# Patient Record
Sex: Female | Born: 2000 | Race: Black or African American | Hispanic: No | Marital: Single
Health system: Southern US, Community
[De-identification: ages and names within clinical notes are randomized; demographics above are authoritative.]

---
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
## 2021-09-12 ENCOUNTER — Emergency Department
Admission: EM | Admit: 2021-09-12 | Discharge: 2021-09-12 | Disposition: A | Discharge status: Home / Self Care | Payer: No Typology Code available for payment source | Attending: Emergency Medicine | Admitting: Emergency Medicine

## 2021-09-12 ENCOUNTER — Other Ambulatory Visit: Payer: Self-pay

## 2021-09-12 ENCOUNTER — Emergency Department: Payer: No Typology Code available for payment source

## 2021-09-12 ENCOUNTER — Encounter: Payer: Self-pay | Admitting: Emergency Medicine

## 2021-09-12 DIAGNOSIS — M25512 Pain in left shoulder: Secondary | ICD-10-CM | POA: Insufficient documentation

## 2021-09-12 DIAGNOSIS — R079 Chest pain, unspecified: Secondary | ICD-10-CM | POA: Insufficient documentation

## 2021-09-12 DIAGNOSIS — Y92410 Unspecified street and highway as the place of occurrence of the external cause: Secondary | ICD-10-CM | POA: Diagnosis not present
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
## 2021-09-12 NOTE — Discharge Instructions (Addendum)
-  Take Tylenol/ibuprofen as needed for pain. -Return to the emergency department anytime if you begin to experience any new or worsening symptoms.

## 2021-09-12 NOTE — ED Notes (Signed)
D/c instructions reviewed with pt. Pt v/u.
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
## 2022-02-18 ENCOUNTER — Other Ambulatory Visit: Payer: Self-pay

## 2022-02-18 ENCOUNTER — Encounter: Payer: Self-pay | Admitting: Emergency Medicine

## 2022-02-18 ENCOUNTER — Emergency Department
Admission: EM | Admit: 2022-02-18 | Discharge: 2022-02-18 | Disposition: A | Discharge status: Home / Self Care | Payer: Medicaid Other | Attending: Student in an Organized Health Care Education/Training Program | Admitting: Student in an Organized Health Care Education/Training Program

## 2022-02-18 DIAGNOSIS — J029 Acute pharyngitis, unspecified: Secondary | ICD-10-CM | POA: Insufficient documentation

## 2022-02-18 MED ORDER — AMOXICILLIN 500 MG PO TABS: 500.0000 mg | ORAL_TABLET | Freq: Three times a day (TID) | ORAL | 0 refills | Status: AC

## 2022-02-18 NOTE — Discharge Instructions (Signed)
Please take the medication as prescribed and until finished.  Take tylenol or ibuprofen if needed for pain or fever.  Follow up with primary care or return to the ER if symptoms worsen and you are unable to see primary care.

## 2022-02-18 NOTE — ED Provider Notes (Signed)
   [facility] Provider Note    Event Date/Time   First MD Initiated Contact with Patient 02/18/22 1442     (approximate)   History   Sore Throat   HPI  Leah Barr is a 21 y.o. female  with no significant past medical history presents to the emergency department for treatment and evaluation of sore throat for the past 2 days. No known fever. No known exposure to strep throat or Covid. No alleviating measures prior to arrival.   History reviewed. No pertinent past medical history.   Physical Exam   Triage Vital Signs: ED Triage Vitals  Enc Vitals Group     BP 02/18/22 1438 115/83     Pulse Rate 02/18/22 1438 (!) 105     Resp 02/18/22 1438 18     Temp 02/18/22 1438 98.3 F (36.8 C)     Temp Source 02/18/22 1438 Oral     SpO2 02/18/22 1438 100 %     Weight 02/18/22 1439 150 lb (68 kg)     Height 02/18/22 1439 5\' 5"  (1.651 m)     Head Circumference --      Peak Flow --      Pain Score 02/18/22 1438 6     Pain Loc --      Pain Edu? --      Excl. in GC? --     Most recent vital signs: Vitals:   02/18/22 1438  BP: 115/83  Pulse: (!) 105  Resp: 18  Temp: 98.3 F (36.8 C)  SpO2: 100%    General: Awake, no distress.  CV:  Good peripheral perfusion.  Resp:  Normal effort.  Abd:  No distention.  Other:  Tonsils 2+ with copious exudate   ED Results / Procedures / Treatments   Labs (all labs ordered are listed, but only abnormal results are displayed) Labs Reviewed - No data to display   EKG     RADIOLOGY  Not indicated.  I have independently reviewed and interpreted imaging as well as reviewed report from radiology.  PROCEDURES:  Critical Care performed: No  Procedures   MEDICATIONS ORDERED IN ED:  Medications - No data to display   IMPRESSION / MDM / ASSESSMENT AND PLAN / ED COURSE   I reviewed the triage vital signs and the nursing notes.  Differential diagnosis includes, but is not limited to:  Strep throat, Mono, Covid  Patient's presentation is most consistent with acute, uncomplicated illness.  21 year old female presents to the ER for evaluation of sore throat x 2 days. Exam consistent with strep throat. She will be treated with Amoxicillin and advised to take naprosyn or tylenol for pain or fever. She is to follow up with PCP or return to the ER for symptoms that change or worsen.      FINAL CLINICAL IMPRESSION(S) / ED DIAGNOSES   Final diagnoses:  Exudative pharyngitis     Rx / DC Orders   ED Discharge Orders          Ordered    amoxicillin (AMOXIL) 500 MG tablet  3 times daily        02/18/22 1444             Note:  This document was prepared using Dragon voice recognition software and may include unintentional dictation errors.   02/20/22, FNP 02/21/22 02/23/22    [id number], MD 02/22/22 [phone]

## 2022-02-18 NOTE — ED Triage Notes (Signed)
Patient to ED for sore throat x 2 days. States its uncomfortable to swallow and just not feeling well. Denies fevers.

## 2023-07-22 ENCOUNTER — Other Ambulatory Visit: Payer: Self-pay

## 2023-07-22 DIAGNOSIS — R109 Unspecified abdominal pain: Secondary | ICD-10-CM | POA: Insufficient documentation

## 2023-07-22 DIAGNOSIS — Z5321 Procedure and treatment not carried out due to patient leaving prior to being seen by health care provider: Secondary | ICD-10-CM | POA: Insufficient documentation

## 2023-07-22 DIAGNOSIS — R197 Diarrhea, unspecified: Secondary | ICD-10-CM | POA: Diagnosis not present

## 2023-07-22 DIAGNOSIS — R112 Nausea with vomiting, unspecified: Secondary | ICD-10-CM | POA: Diagnosis present

## 2023-07-22 LAB — COMPREHENSIVE METABOLIC PANEL
ALT: 30 U/L (ref 0–44)
AST: 31 U/L (ref 15–41)
Albumin: 3.9 g/dL (ref 3.5–5.0)
Alkaline Phosphatase: 68 U/L (ref 38–126)
Anion gap: 9 (ref 5–15)
BUN: 16 mg/dL (ref 6–20)
CO2: 23 mmol/L (ref 22–32)
Calcium: 9 mg/dL (ref 8.9–10.3)
Chloride: 101 mmol/L (ref 98–111)
Creatinine, Ser: 0.75 mg/dL (ref 0.44–1.00)
GFR, Estimated: >60 mL/min (ref >60)
Glucose, Bld: 177 mg/dL — ABNORMAL HIGH (ref 70–99)
Potassium: 3.7 mmol/L (ref 3.5–5.1)
Sodium: 133 mmol/L — ABNORMAL LOW (ref 135–145)
Total Bilirubin: 0.8 mg/dL (ref <1.2)
Total Protein: 9.1 g/dL — ABNORMAL HIGH (ref 6.5–8.1)

## 2023-07-22 LAB — CBC
HCT: 29.9 % — ABNORMAL LOW (ref 36.0–46.0)
Hemoglobin: 7.8 g/dL — ABNORMAL LOW (ref 12.0–15.0)
MCH: 16.7 pg — ABNORMAL LOW (ref 26.0–34.0)
MCHC: 26.1 g/dL — ABNORMAL LOW (ref 30.0–36.0)
MCV: 64.2 fL — ABNORMAL LOW (ref 80.0–100.0)
Platelets: 321 10*3/uL (ref 150–400)
RBC: 4.66 MIL/uL (ref 3.87–5.11)
RDW: 21.3 % — ABNORMAL HIGH (ref 11.5–15.5)
WBC: 11.5 10*3/uL — ABNORMAL HIGH (ref 4.0–10.5)
nRBC: 0 % (ref 0.0–0.2)

## 2023-07-22 LAB — LIPASE, BLOOD: Lipase: 30 U/L (ref 11–51)
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
## 2023-07-23 ENCOUNTER — Emergency Department
Admission: EM | Admit: 2023-07-23 | Discharge: 2023-07-23 | Discharge status: Against Medical Advice | Payer: Medicaid Other | Attending: Emergency Medicine | Admitting: Emergency Medicine

## 2023-07-23 NOTE — ED Notes (Signed)
Pt not visualized in ED lobby at this time.
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
